# Patient Record
Sex: Female | Born: 2000 | Race: White | Hispanic: No | Marital: Single | Smoking: Never smoker
Health system: Southern US, Community
[De-identification: ages and names within clinical notes are randomized; demographics above are authoritative.]

## PROBLEM LIST (undated history)

## (undated) DIAGNOSIS — Z789 Other specified health status: Secondary | ICD-10-CM

## (undated) HISTORY — DX: Other specified health status: Z78.9

## (undated) HISTORY — PX: TONSILLECTOMY: SUR1361

---
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
## 2022-11-22 ENCOUNTER — Ambulatory Visit (LOCAL_COMMUNITY_HEALTH_CENTER): Payer: Self-pay

## 2022-11-22 VITALS — BP 118/71 | Ht 62.52 in | Wt 114.0 lb

## 2022-11-22 DIAGNOSIS — Z3009 Encounter for other general counseling and advice on contraception: Secondary | ICD-10-CM

## 2022-11-22 DIAGNOSIS — Z3201 Encounter for pregnancy test, result positive: Secondary | ICD-10-CM

## 2022-11-22 LAB — PREGNANCY, URINE: Preg Test, Ur: POSITIVE — AB

## 2022-11-22 MED ORDER — PRENATAL 27-0.8 MG PO TABS: 1.0000 | ORAL_TABLET | Freq: Every day | ORAL | Status: AC

## 2022-11-22 NOTE — Progress Notes (Signed)
UPT positive. Plans prenatal care at [facility]. Positive preg packet given and reviewed.   The patient was dispensed prenatal vitamins #100 today per SO Dr Lorrin Mais. I provided counseling today regarding the medication. We discussed the medication, the side effects and when to call clinic. Patient given the opportunity to ask questions. Questions answered.   Sent to clerk for presumptive eligiblility/medicaid preg women. Jerel Shepherd, RN

## 2023-01-08 NOTE — L&D Delivery Note (Signed)
 Delivery Note  First Stage: Labor onset: 1700 Augmentation : AROM Analgesia /Anesthesia intrapartum: none AROM at 0501  Second Stage: Complete dilation at 0550 Onset of pushing at 0552 FHR second stage difficulty tracing d/t maternal position, overall reassuring   Delivery of a viable female infant 07/18/2023 at 0646 by Edsel Blush, CNM. delivery of fetal head in OA position with restitution to LOA. No nuchal cord;  Anterior then posterior shoulders delivered easily with gentle downward traction. Baby placed on mom's chest, and attended to by peds.  Cord double clamped after cessation of pulsation, cut by FOB  Third Stage: Placenta delivered Keren intact with 3 VC @ [phone] Placenta disposition: discarded Uterine tone firm / bleeding scant  Cosmetic perineal/bilateral sulcal laceration identified  Anesthesia for repair: lidocaine , IV fentanyl  Repair 2-0 Vicryl, repaired the sulcal laceration, requested Dr. Leonce to come repair th e cosmetic portion of the perineal laceration Est. Blood Loss (mL):  Complications: none  Mom to postpartum.  Baby to Couplet care / Skin to Skin.  Newborn: Birth Weight: TBD, infant skin-to-skin  Apgar Scores: 8, 9 Feeding planned: formula feeding

## 2023-01-09 DIAGNOSIS — Z3400 Encounter for supervision of normal first pregnancy, unspecified trimester: Secondary | ICD-10-CM | POA: Insufficient documentation

## 2023-01-30 LAB — OB RESULTS CONSOLE RPR
RPR: NONREACTIVE
RPR: NONREACTIVE

## 2023-01-30 LAB — OB RESULTS CONSOLE RUBELLA ANTIBODY, IGM
Rubella: IMMUNE
Rubella: IMMUNE

## 2023-01-30 LAB — OB RESULTS CONSOLE VARICELLA ZOSTER ANTIBODY, IGG
Varicella: IMMUNE
Varicella: IMMUNE

## 2023-01-30 LAB — OB RESULTS CONSOLE HIV ANTIBODY (ROUTINE TESTING): HIV: NONREACTIVE

## 2023-01-30 LAB — OB RESULTS CONSOLE HEPATITIS B SURFACE ANTIGEN: Hepatitis B Surface Ag: NEGATIVE

## 2023-01-30 LAB — OB RESULTS CONSOLE GC/CHLAMYDIA
Chlamydia: NEGATIVE
Neisseria Gonorrhea: NEGATIVE

## 2023-01-30 LAB — OB RESULTS CONSOLE ABO/RH: RH Type: POSITIVE

## 2023-05-02 LAB — OB RESULTS CONSOLE HIV ANTIBODY (ROUTINE TESTING): HIV: NONREACTIVE

## 2023-06-25 ENCOUNTER — Other Ambulatory Visit: Payer: Self-pay

## 2023-06-25 DIAGNOSIS — O99013 Anemia complicating pregnancy, third trimester: Secondary | ICD-10-CM | POA: Insufficient documentation

## 2023-06-25 LAB — OB RESULTS CONSOLE GBS: GBS: NEGATIVE

## 2023-06-25 LAB — OB RESULTS CONSOLE GC/CHLAMYDIA
Chlamydia: NEGATIVE
Neisseria Gonorrhea: NEGATIVE

## 2023-06-25 NOTE — Progress Notes (Signed)
 Labs:  -Hgb 9.3 on 06/25/2023  Assessment: 1. Maternal iron deficiency anemia affecting pregnancy in third trimester, antepartum     Plan -IV Iron transfusion with venofer 500 g x 1 dose   Katherine Blackburn, CNM Certified Nurse Midwife [facility]

## 2023-07-02 ENCOUNTER — Ambulatory Visit
Admission: RE | Admit: 2023-07-02 | Discharge: 2023-07-02 | Disposition: A | Discharge status: Home / Self Care | Source: Ambulatory Visit

## 2023-07-02 DIAGNOSIS — O99013 Anemia complicating pregnancy, third trimester: Secondary | ICD-10-CM | POA: Diagnosis present

## 2023-07-02 DIAGNOSIS — Z3A39 39 weeks gestation of pregnancy: Secondary | ICD-10-CM | POA: Insufficient documentation

## 2023-07-02 DIAGNOSIS — D509 Iron deficiency anemia, unspecified: Secondary | ICD-10-CM | POA: Diagnosis not present

## 2023-07-02 MED ORDER — IRON SUCROSE 500 MG IVPB - SIMPLE MED
500.0000 mg | Freq: Once | INTRAVENOUS | Status: AC
  Administered 2023-07-02: 500 mg via INTRAVENOUS
  Filled 2023-07-02: qty 500

## 2023-07-02 MED ORDER — ALBUTEROL SULFATE (2.5 MG/3ML) 0.083% IN NEBU: 2.5000 mg | INHALATION_SOLUTION | Freq: Once | RESPIRATORY_TRACT | Status: DC | PRN

## 2023-07-02 MED ORDER — DIPHENHYDRAMINE HCL 50 MG/ML IJ SOLN: 25.0000 mg | Freq: Once | INTRAMUSCULAR | Status: DC | PRN

## 2023-07-02 MED ORDER — SODIUM CHLORIDE 0.9 % IV BOLUS: 500.0000 mL | Freq: Once | INTRAVENOUS | Status: DC | PRN

## 2023-07-02 MED ORDER — METHYLPREDNISOLONE SODIUM SUCC 125 MG IJ SOLR: 125.0000 mg | Freq: Once | INTRAMUSCULAR | Status: DC | PRN

## 2023-07-02 MED ORDER — SODIUM CHLORIDE 0.9 % IV SOLN: INTRAVENOUS | Status: DC | PRN

## 2023-07-02 MED ORDER — EPINEPHRINE 0.3 MG/0.3ML IJ SOAJ: 0.3000 mg | Freq: Once | INTRAMUSCULAR | Status: DC | PRN

## 2023-07-02 NOTE — Progress Notes (Signed)
 No LMP recorded. Patient is pregnant. Estimated Date of Delivery: 07/22/23  23 y.o. G1P0 at [redacted]w[redacted]d  The primary encounter diagnosis was Encounter for supervision of other normal pregnancy in third trimester (HHS-HCC). Diagnoses of Anemia affecting pregnancy in third trimester (HHS-HCC), Headache in pregnancy, antepartum (HHS-HCC), and Maternal iron  deficiency anemia complicating pregnancy in third trimester (HHS-HCC) were also pertinent to this visit.  S:   Patient concerns today:  - lost her mucus plug a few days ago  Chief Complaint  Patient presents with  . Routine Prenatal Visit    Reports: good fetal movement, periodic braxton hicks contractions  Denies bleeding, contractions, cramping or leaking.   O:   See [facility] flowsheets. BP 112/72   Pulse 79   Ht 160 cm (5' 3)   Wt 73.8 kg (162 lb 12.8 oz)   BMI 28.84 kg/m  Gen: NAD  Pulm: No use of accessory muscles, normal respirations Abdomen: Gravid, nontender  Fundal height: 37cm  FHT by doppler (distinguished from mom): 140bpm  S=D Pelvic: SVE deferred  Ext : mild edema, no rashes Psych: Mood, insight, judgement intact  A/P:  23 y.o. G1P0 at [redacted]w[redacted]d   - Problem list reviewed and/or updated.  1. Encounter for supervision of other normal pregnancy in third trimester (HHS-HCC) GBS negative. Labor plans reviewed; planning nitrous oxide for pain control if possible, would like to avoid epidural if possible. Kick counts reviewed with the patient in detail.  Patient instructed to assess for fetal activity daily at regular intervals.  Counts to be done if decreased activity noted.  Patient instructed to contact the office if counts do not reveal adequate movements. Labor precautions (ROM, vaginal bleeding, s/s labor) reviewed.  Pt verbalized understanding.  2. Anemia affecting pregnancy in third trimester (HHS-HCC) Recommended iron  infusions d/t Hgb 9.3 Next iron  infusion today  3. Headache in pregnancy, antepartum  (HHS-HCC) Had a headache last week, none since, overall doing well    Call with bleeding, contractions, PROM, or decreased fetal movement.   Return in about 1 week (around 07/09/2023) for routine Prenatal.   I personally performed the service, non-incident to. [facility])   EDSEL CHARLIES BLUSH , CNM 07/02/2023 9:51 AM

## 2023-07-02 NOTE — Progress Notes (Signed)
 Pt d/c home.  NAD, denies pain.  Safety maintained.

## 2023-07-09 NOTE — Progress Notes (Signed)
 Obstetrics & Gynecology Office Visit  Subjective  Katherine Blackburn is a 23 y.o. G1P0 at [redacted]w[redacted]d being seen today for ongoing prenatal care.  She is currently monitored for tthis low-risk pregnancy.  No LMP recorded. Patient is pregnant. Estimated Date of Delivery: 07/22/23  History of Present Illness Katherine Blackburn is a 23 year old female who presents for a prenatal appointment at [redacted] weeks gestation.  She is currently [redacted] weeks pregnant and recently underwent an iron  transfusion last week. Following the transfusion, she experienced fatigue and felt faint the next day, attributing some of her symptoms to the hot weather. No current headaches have been noted since the day of the transfusion.  Fetal movements have been good, with notable activity described as 'waves' in her stomach. She is uncertain if she has experienced contractions but describes having menstrual-like cramps and abdominal tightness. She reports difficulty sleeping due to discomfort and tightness, particularly at night, but does not have difficulty falling asleep or waking up to urinate.  She plans to formula feed her baby and has prepared her hospital bag. She notes that the baby feels like she is pressing on her bladder, and she occasionally feels large movements, which she attributes to limbs. No back pain is experienced.    Pt denies contractions, vaginal bleeding, leaking fluid. Endorses good fetal movement Pt denies HA, VD or RUQ pain.   Objective  BP 138/83   Pulse 80   Ht 160 cm (5' 3)   Wt 73.9 kg (163 lb)   BMI 28.87 kg/m    Fetal Status: Fetal Heart Rate: 155 bpm Fundal Height (cm): 37 cm Movement: Present  Presentation: Cephalic Pre-pregnant weight: 50.0 kg (110 lb)  TWG: 24 kg (53 lb)  Gen: NAD  Pulm: No use of accessory muscles, normal respirations Abdomen: Gravid, nontender Ext : No edema, no rashes.   Psych: Mood, insight, judgement intact SVE: deferred  Fundal height: S=D  Assessment   23 y.o. G1P0  at [redacted]w[redacted]d by  07/22/2023, by Ultrasound presenting for routine prenatal visit  The primary encounter diagnosis was Encounter for supervision of other normal pregnancy in third trimester (HHS-HCC). A diagnosis of Maternal iron  deficiency anemia complicating pregnancy in third trimester (HHS-HCC) was also pertinent to this visit.   Plan   Problem list reviewed and/or updated   Assessment & Plan GBS negative. Labor plans reviewed. Kick counts reviewed with the patient in detail.  Patient instructed to assess for fetal activity daily at regular intervals.  Counts to be done if decreased activity noted.  Patient instructed to contact the office if counts do not reveal adequate movements. Labor precautions (ROM, vaginal bleeding, s/s labor) reviewed.  Pt verbalized understanding.   Pregnancy [redacted] weeks gestation with normal symptoms. Fetus in cephalic presentation, occiput posterior. Discussed labor onset and induction timeline. Preference for spontaneous labor unless complications arise. - Continue weekly prenatal appointments to monitor blood pressure and fetal position. - Encourage birth ball and prenatal yoga for fetal positioning. - Advise on labor signs and hospital admission timing. - Ensure hospital bags are prepared. - Plan for formula feeding postpartum.  Headache No recent headaches. Monitoring blood pressure as a potential factor. - Monitor blood pressure at weekly appointments.  General Health Maintenance Emphasized hydration, heat avoidance, and comfort measures. - Maintain hydration and avoid heat. - Use warm showers or baths for comfort. - Use pillows for optimal sleep positioning. - Walk frequently, avoiding peak heat.    Return in about 1 week (around 07/16/2023) for  routine PNC with CNM.   Attestation Statement:   I personally performed the service, non-incident to. (WP)   ANNA MICHELLE MACKIE, CNM   This note has been created using automated tools and reviewed for  accuracy by [facility].

## 2023-07-15 NOTE — Progress Notes (Signed)
 No LMP recorded. Patient is pregnant. Estimated Date of Delivery: 07/22/23  23 y.o. G1P0 at [redacted]w[redacted]d  The primary encounter diagnosis was Encounter for supervision of other normal pregnancy in third trimester (HHS-HCC). Diagnoses of Anemia affecting pregnancy in third trimester (HHS-HCC) and Maternal iron  deficiency anemia complicating pregnancy in third trimester (HHS-HCC) were also pertinent to this visit.  S:   Patient concerns today:  - wants to know if it is normal that she has not noticed any contractions  Chief Complaint  Patient presents with  . Routine Prenatal Visit    Reports: good fetal movement  Denies bleeding, contractions, cramping or leaking.   O:   See [facility] flowsheets. BP 123/71   Pulse 85   Wt 75.2 kg (165 lb 12.8 oz)   BMI 29.37 kg/m  Gen: NAD  Pulm: No use of accessory muscles, normal respirations Abdomen: Gravid, nontender  Fundal height: 39cm  FHT by doppler (distinguished from mom): 130bpm  S=D Pelvic: SVE deferred  Ext : mild edema, no rashes Psych: Mood, insight, judgement intact  A/P:  23 y.o. G1P0 at [redacted]w[redacted]d   - Problem list reviewed and/or updated.  1. Encounter for supervision of other normal pregnancy in third trimester (HHS-HCC) GBS negative. Labor plans reviewed; hoping to avoid an epidural. Kick counts reviewed with the patient in detail.  Patient instructed to assess for fetal activity daily at regular intervals.  Counts to be done if decreased activity noted.  Patient instructed to contact the office if counts do not reveal adequate movements. Labor precautions (ROM, vaginal bleeding, s/s labor) reviewed.  Pt verbalized understanding. Reviewed that it is normal to experience no or minimal contractions until labor starts. It can also be normal to experience regular contractions and not be in labor.   2. Anemia affecting pregnancy in third trimester (HHS-HCC) Taking iron  supplements    Call with bleeding, contractions, PROM, or  decreased fetal movement.   Return in about 1 week (around 07/22/2023) for routine Prenatal.   I personally performed the service, non-incident to. [facility])   DANIELLE CHARLIES BLUSH , CNM 07/15/2023 3:48 PM

## 2023-07-17 ENCOUNTER — Other Ambulatory Visit: Payer: Self-pay

## 2023-07-17 ENCOUNTER — Encounter: Payer: Self-pay | Admitting: Obstetrics and Gynecology

## 2023-07-17 ENCOUNTER — Inpatient Hospital Stay
Admission: EM | Admit: 2023-07-17 | Discharge: 2023-07-20 | DRG: 806 | Disposition: A | Discharge status: Home / Self Care

## 2023-07-17 DIAGNOSIS — Z3A39 39 weeks gestation of pregnancy: Secondary | ICD-10-CM

## 2023-07-17 DIAGNOSIS — O26893 Other specified pregnancy related conditions, third trimester: Secondary | ICD-10-CM | POA: Diagnosis present

## 2023-07-17 DIAGNOSIS — D62 Acute posthemorrhagic anemia: Secondary | ICD-10-CM | POA: Diagnosis not present

## 2023-07-17 DIAGNOSIS — O479 False labor, unspecified: Principal | ICD-10-CM | POA: Diagnosis present

## 2023-07-17 DIAGNOSIS — F1290 Cannabis use, unspecified, uncomplicated: Secondary | ICD-10-CM | POA: Diagnosis present

## 2023-07-17 DIAGNOSIS — O9081 Anemia of the puerperium: Secondary | ICD-10-CM | POA: Diagnosis not present

## 2023-07-17 DIAGNOSIS — O99019 Anemia complicating pregnancy, unspecified trimester: Secondary | ICD-10-CM | POA: Diagnosis present

## 2023-07-17 MED ORDER — LIDOCAINE HCL (PF) 1 % IJ SOLN
30.0000 mL | INTRAMUSCULAR | Status: AC | PRN
  Administered 2023-07-18: 30 mL via SUBCUTANEOUS

## 2023-07-17 MED ORDER — ACETAMINOPHEN 325 MG PO TABS
650.0000 mg | ORAL_TABLET | ORAL | Status: DC | PRN
  Filled 2023-07-17: qty 2

## 2023-07-17 MED ORDER — CALCIUM CARBONATE ANTACID 500 MG PO CHEW: 2.0000 | CHEWABLE_TABLET | ORAL | Status: DC | PRN

## 2023-07-17 MED ORDER — OXYTOCIN BOLUS FROM INFUSION
333.0000 mL | Freq: Once | INTRAVENOUS | Status: AC
  Administered 2023-07-18: 333 mL via INTRAVENOUS

## 2023-07-17 MED ORDER — LACTATED RINGERS IV SOLN: 500.0000 mL | INTRAVENOUS | Status: DC | PRN

## 2023-07-17 MED ORDER — ONDANSETRON HCL 4 MG/2ML IJ SOLN
4.0000 mg | Freq: Four times a day (QID) | INTRAMUSCULAR | Status: DC | PRN
  Filled 2023-07-17: qty 2

## 2023-07-17 MED ORDER — ACETAMINOPHEN 325 MG PO TABS
650.0000 mg | ORAL_TABLET | ORAL | Status: DC | PRN
  Administered 2023-07-18: 650 mg via ORAL

## 2023-07-17 MED ORDER — SOD CITRATE-CITRIC ACID 500-334 MG/5ML PO SOLN: 30.0000 mL | ORAL | Status: DC | PRN

## 2023-07-17 MED ORDER — FENTANYL CITRATE (PF) 100 MCG/2ML IJ SOLN
50.0000 ug | INTRAMUSCULAR | Status: DC | PRN
  Administered 2023-07-18: 100 ug via INTRAVENOUS
  Filled 2023-07-17: qty 2

## 2023-07-17 MED ORDER — OXYTOCIN-SODIUM CHLORIDE 30-0.9 UT/500ML-% IV SOLN
2.5000 [IU]/h | INTRAVENOUS | Status: DC
  Administered 2023-07-18: 2.5 [IU]/h via INTRAVENOUS
  Filled 2023-07-17: qty 500

## 2023-07-17 MED ORDER — LACTATED RINGERS IV SOLN: INTRAVENOUS | Status: DC

## 2023-07-17 NOTE — OB Triage Note (Signed)
 Pt Katherine Blackburn 23 y.o. presents to labor and delivery triage reporting contractions that started around 4pm today.Pt is a G1P0000 at [redacted]w[redacted]d . Pt denies signs and symptons consistent with rupture of membranes or active vaginal bleeding. Patient states positive fetal movement. External FM and TOCO applied to non-tender abdomen and assessing. Initial FHR 140. Vital signs obtained and within normal limits. Provider notified of pt.

## 2023-07-17 NOTE — H&P (Signed)
 OB History & Physical   History of Present Illness:  Chief Complaint:   HPI:  Katherine Blackburn is a 23 y.o. G1P0000 female at [redacted]w[redacted]d dated by [redacted]w[redacted]d US  not consistent with LMP.  She presents to L&D for uterine contractions that started around 5pm and have become steadily stronger and closer together. She denies vaginal bleeding and leaking fluid. She endorses good fetal movement.   Pregnancy Issues: 1. 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 Anemia, received iron  infusions  Maternal Medical History:   Past Medical History:  Diagnosis Date   Patient denies medical problems     Past Surgical History:  Procedure Laterality Date   TONSILLECTOMY     age 19    Allergies  Allergen Reactions   Other Itching    Gel Nail Polish Itching of fingers and skin peeling on fingers    Prior to Admission medications   Medication Sig Start Date End Date Taking? Authorizing Provider  ferrous sulfate 325 (65 FE) MG EC tablet Take 325 mg by mouth 3 (three) times daily with meals.   Yes [provider]  magnesium oxide (MAG-OX) 400 (240 Mg) MG tablet Take 400 mg by mouth daily.   Yes [provider]  Prenatal Vit-Fe Fumarate-FA (PRENATAL MULTIVITAMIN) TABS tablet Take 1 tablet by mouth daily at 12 noon.   Yes [provider]     Prenatal care site: [facility]   Social History: She  reports that she has never smoked. She has never used smokeless tobacco. She reports that she does not currently use alcohol. She reports current drug use. Drug: Marijuana.  Family History: family history is not on file.   Review of Systems: A full review of systems was performed and negative except as noted in the HPI.     Physical Exam:  Vital Signs: BP 126/76   Pulse 81   Temp 98.4 F (36.9 C) (Oral)   LMP 10/05/2022 (Exact Date)  General: no acute distress.  HEENT: normocephalic, atraumatic Heart: regular rate & rhythm.  No  murmurs/rubs/gallops Lungs: clear to auscultation bilaterally, normal respiratory effort Abdomen: soft, gravid, non-tender;  EFW: 7lb Pelvic:   External: Normal external female genitalia  Cervix: Dilation: 5.5 / Effacement (%): 80 / Station: -1    Extremities: non-tender, symmetric, mild edema bilaterally.  DTRs: +2  Neurologic: Alert & oriented x 3.    No results found for this or any previous visit (from the past 24 hours).  Pertinent Results:  Prenatal Labs: Blood type/Rh B positive  Antibody screen neg  Rubella Immune  Varicella Immune  RPR NR  HBsAg Neg  HIV NR  GC neg  Chlamydia neg  Genetic screening negative  1 hour GTT 73  3 hour GTT   GBS Negative   FHT: 135bpm, moderate variability, accelerations present, no decelerations, Category I tracing TOCO: contractions q2-87min, palpate moderate SVE:  Dilation: 5.5 / Effacement (%): 80 / Station: -1    Cephalic by leopolds  No results found.  Assessment:  Katherine Blackburn is a 23 y.o. G1P0000 female at [redacted]w[redacted]d with active labor admission.   Plan:  1. Admit to Labor & Delivery; consents reviewed and obtained - Dr. Leonce notified of admission and plan of care  2. Fetal Well being  - Fetal Tracing: Category I tracing - Group B Streptococcus ppx indicated: n/a, GBS negative - Presentation: vertex confirmed by SVE   3. Routine OB: - Prenatal labs reviewed, as above - Rh  positive - CBC, T&S, RPR on admit - Clear fluids, saline lock  4. Monitoring of Labor -  Contractions q2-41min, external toco in place -  Pelvis adequate for trial of labor -  Plan for augmentation with AROM And/or pitocin  if needed -  Plan for continuous fetal monitoring  -  Maternal pain control as desired; requesting unmedicated comfort measures but open to epidural if needed - Anticipate vaginal delivery  5. Post Partum Planning: - Infant feeding: formula - Contraception: condoms - Tdap: declined - Flu: out of season  6. Marijuana use  in pregnancy: - reports last marijuana use was December 2024 - no UDS in the pregnancy - UDS in L&D  Edsel Charlies Blush, CNM 07/17/23 11:36 PM

## 2023-07-18 ENCOUNTER — Encounter: Payer: Self-pay | Admitting: Obstetrics and Gynecology

## 2023-07-18 DIAGNOSIS — F1290 Cannabis use, unspecified, uncomplicated: Secondary | ICD-10-CM | POA: Diagnosis present

## 2023-07-18 DIAGNOSIS — O99019 Anemia complicating pregnancy, unspecified trimester: Secondary | ICD-10-CM | POA: Diagnosis present

## 2023-07-18 LAB — TYPE AND SCREEN
ABO/RH(D): B POS
Antibody Screen: NEGATIVE

## 2023-07-18 LAB — CBC
HCT: 33.8 % — ABNORMAL LOW (ref 36.0–46.0)
Hemoglobin: 11 g/dL — ABNORMAL LOW (ref 12.0–15.0)
MCH: 28.1 pg (ref 26.0–34.0)
MCHC: 32.5 g/dL (ref 30.0–36.0)
MCV: 86.2 fL (ref 80.0–100.0)
Platelets: 269 K/uL (ref 150–400)
RBC: 3.92 MIL/uL (ref 3.87–5.11)
RDW: 20 % — ABNORMAL HIGH (ref 11.5–15.5)
WBC: 9.6 K/uL (ref 4.0–10.5)
nRBC: 0 % (ref 0.0–0.2)

## 2023-07-18 LAB — URINE DRUG SCREEN, QUALITATIVE (ARMC ONLY)
Amphetamines, Ur Screen: NOT DETECTED
Barbiturates, Ur Screen: NOT DETECTED
Benzodiazepine, Ur Scrn: NOT DETECTED
Cannabinoid 50 Ng, Ur ~~LOC~~: NOT DETECTED
Cocaine Metabolite,Ur ~~LOC~~: NOT DETECTED
MDMA (Ecstasy)Ur Screen: NOT DETECTED
Methadone Scn, Ur: NOT DETECTED
Opiate, Ur Screen: NOT DETECTED
Phencyclidine (PCP) Ur S: NOT DETECTED
Tricyclic, Ur Screen: NOT DETECTED

## 2023-07-18 LAB — ABO/RH: ABO/RH(D): B POS

## 2023-07-18 LAB — RPR: RPR Ser Ql: NONREACTIVE

## 2023-07-18 MED ORDER — PRENATAL MULTIVITAMIN CH: 1.0000 | ORAL_TABLET | Freq: Every day | ORAL | Status: DC

## 2023-07-18 MED ORDER — COCONUT OIL OIL: 1.0000 | TOPICAL_OIL | Status: DC | PRN

## 2023-07-18 MED ORDER — IBUPROFEN 800 MG PO TABS
800.0000 mg | ORAL_TABLET | Freq: Four times a day (QID) | ORAL | Status: DC | PRN
  Administered 2023-07-18: 800 mg via ORAL

## 2023-07-18 MED ORDER — ONDANSETRON HCL 4 MG/2ML IJ SOLN: 4.0000 mg | INTRAMUSCULAR | Status: DC | PRN

## 2023-07-18 MED ORDER — MISOPROSTOL 200 MCG PO TABS
ORAL_TABLET | ORAL | Status: AC
  Filled 2023-07-18: qty 4

## 2023-07-18 MED ORDER — IBUPROFEN 800 MG PO TABS
ORAL_TABLET | ORAL | Status: AC
  Administered 2023-07-19: 600 mg via ORAL
  Filled 2023-07-18: qty 1

## 2023-07-18 MED ORDER — LIDOCAINE HCL (PF) 1 % IJ SOLN
INTRAMUSCULAR | Status: AC
  Administered 2023-07-18: 30 mL
  Filled 2023-07-18: qty 30

## 2023-07-18 MED ORDER — TETANUS-DIPHTH-ACELL PERTUSSIS 5-2.5-18.5 LF-MCG/0.5 IM SUSY: 0.5000 mL | PREFILLED_SYRINGE | Freq: Once | INTRAMUSCULAR | Status: DC

## 2023-07-18 MED ORDER — SIMETHICONE 80 MG PO CHEW: 80.0000 mg | CHEWABLE_TABLET | ORAL | Status: DC | PRN

## 2023-07-18 MED ORDER — WITCH HAZEL-GLYCERIN EX PADS
1.0000 | MEDICATED_PAD | CUTANEOUS | Status: DC | PRN
  Administered 2023-07-19 (×2): 1 via TOPICAL
  Filled 2023-07-18 (×3): qty 100

## 2023-07-18 MED ORDER — BENZOCAINE-MENTHOL 20-0.5 % EX AERO
1.0000 | INHALATION_SPRAY | CUTANEOUS | Status: DC | PRN
  Filled 2023-07-18: qty 56

## 2023-07-18 MED ORDER — ONDANSETRON HCL 4 MG PO TABS: 4.0000 mg | ORAL_TABLET | ORAL | Status: DC | PRN

## 2023-07-18 MED ORDER — SENNOSIDES-DOCUSATE SODIUM 8.6-50 MG PO TABS
2.0000 | ORAL_TABLET | Freq: Every day | ORAL | Status: DC
  Administered 2023-07-19 – 2023-07-20 (×2): 2 via ORAL
  Filled 2023-07-18 (×2): qty 2

## 2023-07-18 MED ORDER — IBUPROFEN 600 MG PO TABS
600.0000 mg | ORAL_TABLET | Freq: Four times a day (QID) | ORAL | Status: DC
  Administered 2023-07-18 – 2023-07-20 (×6): 600 mg via ORAL
  Filled 2023-07-18 (×8): qty 1

## 2023-07-18 MED ORDER — DIBUCAINE (PERIANAL) 1 % EX OINT
1.0000 | TOPICAL_OINTMENT | CUTANEOUS | Status: DC | PRN
  Filled 2023-07-18 (×2): qty 28

## 2023-07-18 MED ORDER — DIPHENHYDRAMINE HCL 25 MG PO CAPS: 25.0000 mg | ORAL_CAPSULE | Freq: Four times a day (QID) | ORAL | Status: DC | PRN

## 2023-07-18 MED ORDER — ACETAMINOPHEN 325 MG PO TABS: 650.0000 mg | ORAL_TABLET | ORAL | Status: DC | PRN

## 2023-07-18 MED ORDER — ZOLPIDEM TARTRATE 5 MG PO TABS: 5.0000 mg | ORAL_TABLET | Freq: Every evening | ORAL | Status: DC | PRN

## 2023-07-18 NOTE — Progress Notes (Signed)
 Called to see patient after delivery by CNM due to patient discomfort and incomplete second degree repair.  Per CNM, patient had unmedicated 2nd degree vaginal and perineal laceration. She had repaired the sulcal lacerations and most of the vaginal and perineal. There were lateral areas that need to be re-approximated.   The patient had significant edema of the area when I arrived. The sulcal area appeared re-approximated when I arrived, though I was unable to get a good exam due to patient discomfort. She was no bleeding from these areas and from what I could palpate the appeared adequately repaired. I injected additional lidocaine  and performed a more superficial repair of the labial and a portion of the perineum. There was a left labial laceration that was hemostatic that I did not repair and will allow to re-approximate on its own. Patient did tolerate the procedure well overall.   Blood loss about 5-10 mL with my portion of the repair.  See CNM note for more details, as indicated.  Monitor hemoglobin for appropriate drop in hemoglobin and the usual postpartum care, otherwise.  The CNM acted as my chaperone during Holiday representative.  Garnette Mace, MD, [facility] 07/18/2023 8:47 AM

## 2023-07-18 NOTE — Discharge Summary (Signed)
 Postpartum Discharge Summary  Patient Name: Katherine Blackburn DOB: 2000/01/23 MRN: [id number]  Date of admission: 07/17/2023 Delivery date:07/18/2023 Delivering provider: TANDA HOUSTON RENEE Date of discharge: 07/20/2023  Primary OB: [facility] OFE:Ejupzwu'd last menstrual period was 10/05/2022 (exact date). EDC Estimated Date of Delivery: 07/22/23 Gestational Age at Delivery: [redacted]w[redacted]d   Admitting diagnosis: Uterine contractions [O47.9] Intrauterine pregnancy: [redacted]w[redacted]d     Secondary diagnosis:   Principal Problem:   NSVD (normal spontaneous vaginal delivery) Active Problems:   Uterine contractions   Marijuana use during pregnancy   Anemia affecting pregnancy  Discharge Diagnosis: Term Pregnancy Delivered                                                Post partum procedures:IV Venofer   Augmentation:: AROM Complications: None Delivery Type: spontaneous vaginal delivery Anesthesia: non-pharmacological methods Placenta: spontaneous To Pathology: No  Laceration: bilateral sulcal repaired by D. TANDA, CNM, cosmetic perineal repair repaired by Dr. Leonce Episiotomy: none  Prenatal Labs:  Blood type/Rh B positive  Antibody screen neg  Rubella Immune  Varicella Immune  RPR NR  HBsAg Neg  HIV NR  GC neg  Chlamydia neg  Genetic screening negative  1 hour GTT 73  3 hour GTT    GBS Negative    Hospital course: Onset of Labor With Vaginal Delivery      23 y.o. yo G1P1001 at [redacted]w[redacted]d was admitted in Active Labor on 07/17/2023. Labor course was without complication. She was augmented with AROM and progressed to 10/100/+2 and pushed for about 1hr, delivering viable female infant. Membrane Rupture Time/Date: 5:01 AM,07/18/2023  Delivery Method:Vaginal, Spontaneous Operative Delivery:N/A Episiotomy:   Lacerations:  1st degree;Sulcus;Perineal;2nd degree Patient had a postpartum course complicated by postpartum anemia. IV venofer  given for 1 dose. She was instructed to continue  iron  supplements every other day for 4-6 weeks postpartum.  She is ambulating, tolerating a regular diet, passing flatus, and urinating well. Patient is discharged home in stable condition on 07/20/23.  Newborn Data: Sharilyn  Birth date:07/18/2023 Birth time:6:46 AM Gender:Female Living status:Living Apgars:8 ,9  Weight:3660 g  Magnesium Sulfate received: No BMZ received: No Rhophylac:No MMR:No Varivax vaccine given: was not indicated T-DaP:declined Flu: No  Transfusion:No  Physical exam  Vitals:   07/19/23 1530 07/19/23 1907 07/20/23 0427 07/20/23 0809  BP: 120/73 122/79 101/63 (!) 105/59  Pulse: 88 78 72 88  Resp: 18 18 18 18   Temp: 98.4 F (36.9 C) 98 F (36.7 C) 98.5 F (36.9 C) 98.4 F (36.9 C)  TempSrc: Oral   Oral  SpO2:  99% 96% 99%  Weight:      Height:       General: alert, cooperative, and no distress Lochia: appropriate Uterine Fundus: firm Perineum:minimal edema/repair well approximated DVT Evaluation: No evidence of DVT seen on physical exam.  Labs: Lab Results  Component Value Date   WBC 11.4 (H) 07/19/2023   HGB 8.9 (L) 07/19/2023   HCT 28.8 (L) 07/19/2023   MCV 89.7 07/19/2023   PLT 234 07/19/2023       No data to display         Edinburgh Score:    07/18/2023    3:01 PM  Edinburgh Postnatal Depression Scale Screening Tool  I have been able to laugh and see the funny side of things. 0  I have looked forward with  enjoyment to things. 0  I have blamed myself unnecessarily when things went wrong. 0  I have been anxious or worried for no good reason. 0  I have felt scared or panicky for no good reason. 0  Things have been getting on top of me. 0  I have been so unhappy that I have had difficulty sleeping. 0  I have felt sad or miserable. 0  I have been so unhappy that I have been crying. 0  The thought of harming myself has occurred to me. 0  Edinburgh Postnatal Depression Scale Total 0    Risk assessment for postpartum VTE and  prophylactic treatment: Very high risk factors: None High risk factors: None Moderate risk factors: None  Postpartum VTE prophylaxis with LMWH not indicated  After visit meds:  Allergies as of 07/20/2023       Reactions   Other Itching   Gel Nail Polish Itching of fingers and skin peeling on fingers        Medication List     TAKE these medications    acetaminophen  500 MG tablet Commonly known as: TYLENOL  Take 2 tablets (1,000 mg total) by mouth every 6 (six) hours as needed (for pain scale < 4).   ferrous sulfate  325 (65 FE) MG EC tablet Take 1 tablet (325 mg total) by mouth every other day. What changed: when to take this   ibuprofen  600 MG tablet Commonly known as: ADVIL  Take 1 tablet (600 mg total) by mouth every 6 (six) hours as needed for mild pain (pain score 1-3), moderate pain (pain score 4-6) or cramping.   magnesium oxide 400 (240 Mg) MG tablet Commonly known as: MAG-OX Take 400 mg by mouth daily.   prenatal multivitamin Tabs tablet Take 1 tablet by mouth daily at 12 noon.       Discharge home in stable condition Infant Feeding: Bottle Infant Disposition:home with mother Discharge instruction: per After Visit Summary and Postpartum booklet. Activity: Advance as tolerated. Pelvic rest for 6 weeks.  Diet: routine diet and iron  rich diet Anticipated Birth Control: Condoms Postpartum Appointment:6 weeks Additional Postpartum F/U: None Future Appointments:No future appointments. Follow up Visit:  Follow-up Information     Tanda Edsel Fuller, CNM. Schedule an appointment as soon as possible for a visit in 6 week(s).   Specialty: Certified Nurse Midwife Why: postpartum visit Contact information: [address] [place] [address] [phone]                 Plan:  Katherine Blackburn was discharged to home in good condition. Follow-up appointment as directed.    Signed:  Therisa CHRISTELLA Pillow, CNM 07/20/2023 9:38 AM  Therisa Pillow, CNM Certified Nurse Midwife [facility]

## 2023-07-18 NOTE — Progress Notes (Signed)
 Labor Progress Note  Katherine Blackburn is a 23 y.o. G1P0000 at [redacted]w[redacted]d by ultrasound admitted for active labor  Subjective: notified by RN that patient has a swollen anterior lip and an urge to push  Objective: BP (!) 118/90   Pulse 65   Temp 98.2 F (36.8 C) (Oral)   Resp 14   Ht 5' 4 (1.626 m)   Wt 75.3 kg   LMP 10/05/2022 (Exact Date)   BMI 28.49 kg/m  Notable VS details: reviewed  Fetal Assessment: FHT:  FHR: 125 bpm, variability: moderate,  accelerations:  Present,  decelerations:  Absent, intermittently recording maternal heart rate Category/reactivity:  Category I UC:   regular, every 2-3 minutes SVE:    Dilation: 8cm  Effacement: 100%  Station:  0  Consistency: soft  Position: anterior  Membrane status:AROM @ 0500 Amniotic color: clear  Labs: Lab Results  Component Value Date   WBC 9.6 07/17/2023   HGB 11.0 (L) 07/17/2023   HCT 33.8 (L) 07/17/2023   MCV 86.2 07/17/2023   PLT 269 07/17/2023    Assessment / Plan: 23 year old G1P0 at [redacted]w[redacted]d with active labor  Labor: Good labor progress, patient noted to be 8/100/0 with BBOW upon my exam. Encouraged patient not to push but to try position changes to allow fetal rotation and descent. AROM with clear fluid. Patient repositioned to hands and knees. Preeclampsia:  BP 118/90 Fetal Wellbeing:  Category I, RN holding monitor to better trace FHT Pain Control:  Labor support without medications and encouraged a dose of IVPM to help her relax. She declines at this time. I/D:  GBS negative Anticipated MOD:  NSVD  Edsel Charlies Blush, CNM 07/18/2023, 5:09 AM

## 2023-07-18 NOTE — Progress Notes (Signed)
 Labor Progress Note  Katherine Blackburn is a 23 y.o. G1P0000 at [redacted]w[redacted]d by ultrasound admitted for active labor  Subjective: notified by RN that patient is 9cm and tolerating labor well  Objective: BP (!) 118/90   Pulse 65   Temp 98.2 F (36.8 C) (Oral)   Resp 14   Ht 5' 4 (1.626 m)   Wt 75.3 kg   LMP 10/05/2022 (Exact Date)   BMI 28.49 kg/m  Notable VS details: reviewed  Fetal Assessment: FHT:  FHR: 135 bpm, variability: moderate,  accelerations:  Present,  decelerations:  Absent Category/reactivity:  Category I UC:   regular, every 2-3 minutes SVE:   per RN exam Dilation: 9cm  Effacement: 100%  Station:  -1  Consistency: soft  Position: middle  Membrane status:intact Amniotic color: n/a  Labs: Lab Results  Component Value Date   WBC 9.6 07/17/2023   HGB 11.0 (L) 07/17/2023   HCT 33.8 (L) 07/17/2023   MCV 86.2 07/17/2023   PLT 269 07/17/2023    Assessment / Plan: Spontaneous labor, progressing normally  Labor: Good labor progress, patient believes her water has broken, no need for augmentation at this time Preeclampsia:  BP WNL Fetal Wellbeing:  Category I Pain Control:  Labor support without medications I/D:  GBS negative Anticipated MOD:  NSVD  Edsel Charlies Blush, CNM 07/18/2023, 5:15 AM

## 2023-07-18 NOTE — Progress Notes (Signed)
 9464 Patient removed TOCO. Patient in transition and unable to tolerate the monitors. Contractions palpated strong every 2-4 minutes until delivery. Intermittent fetal heart tones traced, 0550 pulse ox placed to differentiate FHR from maternal heart rate.

## 2023-07-19 LAB — CBC
HCT: 28.8 % — ABNORMAL LOW (ref 36.0–46.0)
Hemoglobin: 8.9 g/dL — ABNORMAL LOW (ref 12.0–15.0)
MCH: 27.7 pg (ref 26.0–34.0)
MCHC: 30.9 g/dL (ref 30.0–36.0)
MCV: 89.7 fL (ref 80.0–100.0)
Platelets: 234 K/uL (ref 150–400)
RBC: 3.21 MIL/uL — ABNORMAL LOW (ref 3.87–5.11)
RDW: 20.7 % — ABNORMAL HIGH (ref 11.5–15.5)
WBC: 11.4 K/uL — ABNORMAL HIGH (ref 4.0–10.5)
nRBC: 0 % (ref 0.0–0.2)

## 2023-07-19 MED ORDER — SODIUM CHLORIDE 0.9 % IV SOLN: INTRAVENOUS | Status: AC | PRN

## 2023-07-19 MED ORDER — IRON SUCROSE 300 MG IVPB - SIMPLE MED
300.0000 mg | Freq: Once | Status: AC
  Administered 2023-07-19: 300 mg via INTRAVENOUS
  Filled 2023-07-19: qty 300

## 2023-07-19 MED ORDER — SODIUM CHLORIDE 0.9 % IV BOLUS: 500.0000 mL | Freq: Once | INTRAVENOUS | Status: DC | PRN

## 2023-07-19 MED ORDER — DIPHENHYDRAMINE HCL 50 MG/ML IJ SOLN: 25.0000 mg | Freq: Once | INTRAMUSCULAR | Status: DC | PRN

## 2023-07-19 MED ORDER — METHYLPREDNISOLONE SODIUM SUCC 125 MG IJ SOLR: 125.0000 mg | Freq: Once | INTRAMUSCULAR | Status: DC | PRN

## 2023-07-19 MED ORDER — EPINEPHRINE 0.3 MG/0.3ML IJ SOAJ: 0.3000 mg | Freq: Once | INTRAMUSCULAR | Status: DC | PRN

## 2023-07-19 MED ORDER — ALBUTEROL SULFATE (2.5 MG/3ML) 0.083% IN NEBU: 2.5000 mg | INHALATION_SOLUTION | Freq: Once | RESPIRATORY_TRACT | Status: DC | PRN

## 2023-07-19 NOTE — Progress Notes (Addendum)
 Postpartum Day  1  Subjective: 23 y.o. G1P1001 postpartum day #1 status post normal spontaneous vaginal delivery. She is ambulating, is tolerating po, is voiding spontaneously.  Her pain is well controlled on PO pain medications. Her lochia is less than menses.  Objective: BP 109/69   Pulse 93   Temp 98.6 F (37 C) (Oral)   Resp 20   Ht 5' 4 (1.626 m)   Wt 75.3 kg   LMP 10/05/2022 (Exact Date)   SpO2 94%   Breastfeeding Unknown   BMI 28.49 kg/m    Physical Exam:  General: alert, cooperative, and no distress Breasts: soft/nontender Pulm: nl effort Abdomen: soft, non-tender, active bowel sounds Uterine Fundus: firm, deviated to right at U  Perineum: minimal edema, repair well approximated Lochia: appropriate DVT Evaluation: No evidence of DVT seen on physical exam.  Recent Labs    07/17/23 2357 07/19/23 0706  HGB 11.0* 8.9*  HCT 33.8* 28.8*  WBC 9.6 11.4*  PLT 269 234    Assessment/Plan: 23 y.o. G1P1001 postpartum day # 1  1. Continue routine postpartum care -Due to void -Will recheck fundal exam after voiding  -Bladder scanner PRN   2. Infant feeding status: breast feeding -Lactation consult PRN for breastfeeding   3. Contraception plan: condoms  4. Acute blood loss anemia - clinically significant.  -Hemodynamically stable and asymptomatic -Intervention: IV iron  transfusion with venofer  ordered   5. Immunization status:   needs TDap prior to discharge   6. History of marijuana use in pregnancy  -Last use 12/2022 -UDS negative on admission    Disposition: continue inpatient postpartum care , desires discharge home today     LOS: 2 days   Katherine Blackburn, EDDY 07/19/2023, 9:26 AM   ----- Katherine Blackburn  Certified Nurse Midwife [facility]

## 2023-07-19 NOTE — TOC Initial Note (Signed)
 Transition of Care [facility]) - Initial/Assessment Note    Patient Details  Name: Katherine Blackburn MRN: [id number] Date of Birth: 2000-02-15  Transition of Care [facility]) CM/SW Contact:    Edsel DELENA Fischer, LCSW Phone Number: 07/19/2023, 10:19 AM  Clinical Narrative:          SW met with pt at bedside.  When SW arrived partner and pt mother were in the room.  SW asked family to leave.  SW completed assessment with pt.  Per pt:  Pt is a new mom and stated that  its weird, its different.  Tye Long is child's dad and also pt boyfriend in whom pt resides.  No safety/ DV concerns reported.   Pt expressed that she does not have any needs for the baby and that she has supports from her side and boyfriends side of family.  Pt stated that her and her boyfriend live on her boyfriends parents property in a tiny house ( [address], [place], [place]) with their 2 dogs. Pt contact information is [phone]. Pt stated that she has car seat and that baby will sleep in bassinet. 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  Currently employed at [employer] but on maturity leave.   Pt stated that she works at [employer] as well and that owner has asked her to purchase business from her.  Pt stated that she is thinking about it but probably in 4 years.  Pt has WIC and will apply for food stamps.  Plans to have family and partner help with baby when pt returns to work. No mental health concerns reported.  Pt did state that she was in counseling when she was younger, I dont know why.  Appt with pediatrician has been scheduled with [facility], on Monday at 2pm with Dr. Glendia.  Boyfriend will provide transportation home.  Pt did expressed that before she met Tye she had another boyfriend however he was killed in motorcycle accident.  Pt began to tear up.  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  Last used 7 months ago didn't find out I was pregnant  until 2 months and I began to ween myself off.  SW discussed Drug Screen Policy and if screening comes back positive.  No report of DSS involvement.    SW reviewed, discussed and provided pt  handouts (New Mom Checklist for Maternal Mental Health Help, What does a safe sleep environment look like?, Safe sleep for your baby, Car safety seat check up, Perinatal Mood and Anxiety Disorders (PMADS), Free Mental Health Apps, Weed to Know for [employer] and You.    SW will provide grief and substance use resources as well and attach to discharge plan        Barriers to Discharge: Continued Medical Work up   Patient Goals and CMS Choice            Expected Discharge Plan and Services In-house Referral: Clinical Social Work     Living arrangements for the past 2 months: Single Family Home                                      Prior Living Arrangements/Services Living arrangements for the past 2 months: Single Family Home Lives with:: Spouse Patient language and need for interpreter reviewed:: Yes Do you feel safe going back  to the place where you live?: Yes      Need for Family Participation in Patient Care: No (Comment) Care giver support system in place?: Yes (comment)   Criminal Activity/Legal Involvement Pertinent to Current Situation/Hospitalization: No - Comment as needed  Activities of Daily Living   ADL Screening (condition at time of admission) Independently performs ADLs?: Yes (appropriate for developmental age) Is the patient deaf or have difficulty hearing?: No Does the patient have difficulty seeing, even when wearing glasses/contacts?: No Does the patient have difficulty concentrating, remembering, or making decisions?: No  Permission Sought/Granted                  Emotional Assessment Appearance:: Appears stated age Attitude/Demeanor/Rapport: Engaged Affect (typically observed): Appropriate Orientation: : Oriented to Self, Oriented to Place,  Oriented to  Time, Oriented to Situation Alcohol / Substance Use: Other (comment) (history of substance use (Marijuana)) Psych Involvement: No (comment)  Admission diagnosis:  Uterine contractions [O47.9] Patient Active Problem List   Diagnosis Date Noted   NSVD (normal spontaneous vaginal delivery) 07/18/2023   Marijuana use during pregnancy 07/18/2023   Anemia affecting pregnancy 07/18/2023   Uterine contractions 07/17/2023   Maternal iron  deficiency anemia complicating pregnancy in third trimester 06/25/2023   Encounter for supervision of normal first pregnancy 01/09/2023   PCP:  Patient, No Pcp Per Pharmacy:   [id number], [place] - [address] AT [facility] [address] [place] [address] Phone: [phone] Fax: [phone]     Social Drivers of Health (SDOH) Social History: SDOH Screenings   Food Insecurity: No Food Insecurity (07/17/2023)  Housing: Low Risk  (07/17/2023)  Transportation Needs: No Transportation Needs (07/17/2023)  Utilities: Not At Risk (07/17/2023)  Depression (PHQ2-9): Low Risk  (11/22/2022)  Tobacco Use: Low Risk  (07/18/2023)   SDOH Interventions:     Readmission Risk Interventions     No data to display

## 2023-07-19 NOTE — Plan of Care (Signed)
 Problem: Education: Goal: Knowledge of General Education information will improve Description: Including pain rating scale, medication(s)/side effects and non-pharmacologic comfort measures 07/19/2023 0519 by Ramonita Magnolia PARAS, RN Outcome: Progressing 07/19/2023 0519 by Ramonita Magnolia PARAS, RN Outcome: Progressing   Problem: Health Behavior/Discharge Planning: Goal: Ability to manage health-related needs will improve 07/19/2023 0519 by Ramonita Magnolia PARAS, RN Outcome: Progressing 07/19/2023 0519 by Ramonita Magnolia PARAS, RN Outcome: Progressing   Problem: Clinical Measurements: Goal: Ability to maintain clinical measurements within normal limits will improve 07/19/2023 0519 by Ramonita Magnolia PARAS, RN Outcome: Progressing 07/19/2023 0519 by Ramonita Magnolia PARAS, RN Outcome: Progressing Goal: Will remain free from infection 07/19/2023 0519 by Ramonita Magnolia PARAS, RN Outcome: Progressing 07/19/2023 0519 by Ramonita Magnolia PARAS, RN Outcome: Progressing Goal: Diagnostic test results will improve 07/19/2023 0519 by Ramonita Magnolia PARAS, RN Outcome: Progressing 07/19/2023 0519 by Ramonita Magnolia PARAS, RN Outcome: Progressing Goal: Respiratory complications will improve 07/19/2023 0519 by Ramonita Magnolia PARAS, RN Outcome: Progressing 07/19/2023 0519 by Ramonita Magnolia PARAS, RN Outcome: Progressing Goal: Cardiovascular complication will be avoided 07/19/2023 0519 by Ramonita Magnolia PARAS, RN Outcome: Progressing 07/19/2023 0519 by Ramonita Magnolia PARAS, RN Outcome: Progressing   Problem: Activity: Goal: Risk for activity intolerance will decrease 07/19/2023 0519 by Ramonita Magnolia PARAS, RN Outcome: Progressing 07/19/2023 0519 by Ramonita Magnolia PARAS, RN Outcome: Progressing   Problem: Nutrition: Goal: Adequate nutrition will be maintained 07/19/2023 0519 by Ramonita Magnolia PARAS, RN Outcome: Progressing 07/19/2023 0519 by Ramonita Magnolia PARAS, RN Outcome: Progressing   Problem: Coping: Goal: Level of anxiety will decrease 07/19/2023 0519 by Ramonita Magnolia PARAS, RN Outcome:  Progressing 07/19/2023 0519 by Ramonita Magnolia PARAS, RN Outcome: Progressing   Problem: Elimination: Goal: Will not experience complications related to bowel motility 07/19/2023 0519 by Ramonita Magnolia PARAS, RN Outcome: Progressing 07/19/2023 0519 by Ramonita Magnolia PARAS, RN Outcome: Progressing Goal: Will not experience complications related to urinary retention 07/19/2023 0519 by Ramonita Magnolia PARAS, RN Outcome: Progressing 07/19/2023 0519 by Ramonita Magnolia PARAS, RN Outcome: Progressing   Problem: Pain Managment: Goal: General experience of comfort will improve and/or be controlled 07/19/2023 0519 by Ramonita Magnolia PARAS, RN Outcome: Progressing 07/19/2023 0519 by Ramonita Magnolia PARAS, RN Outcome: Progressing   Problem: Safety: Goal: Ability to remain free from injury will improve 07/19/2023 0519 by Ramonita Magnolia PARAS, RN Outcome: Progressing 07/19/2023 0519 by Ramonita Magnolia PARAS, RN Outcome: Progressing   Problem: Skin Integrity: Goal: Risk for impaired skin integrity will decrease 07/19/2023 0519 by Ramonita Magnolia PARAS, RN Outcome: Progressing 07/19/2023 0519 by Ramonita Magnolia PARAS, RN Outcome: Progressing   Problem: Education: Goal: Knowledge of disease or condition will improve 07/19/2023 0519 by Ramonita Magnolia PARAS, RN Outcome: Progressing 07/19/2023 0519 by Ramonita Magnolia PARAS, RN Outcome: Progressing Goal: Knowledge of the prescribed therapeutic regimen will improve 07/19/2023 0519 by Ramonita Magnolia PARAS, RN Outcome: Progressing 07/19/2023 0519 by Ramonita Magnolia PARAS, RN Outcome: Progressing Goal: Individualized Educational Video(s) 07/19/2023 0519 by Ramonita Magnolia PARAS, RN Outcome: Progressing 07/19/2023 0519 by Ramonita Magnolia PARAS, RN Outcome: Progressing   Problem: Clinical Measurements: Goal: Complications related to the disease process, condition or treatment will be avoided or minimized 07/19/2023 0519 by Ramonita Magnolia PARAS, RN Outcome: Progressing 07/19/2023 0519 by Ramonita Magnolia PARAS, RN Outcome: Progressing   Problem: Education: Goal: Knowledge of  Childbirth will improve 07/19/2023 0519 by Ramonita Magnolia PARAS, RN Outcome: Progressing 07/19/2023 0519 by Ramonita Magnolia PARAS, RN Outcome: Progressing Goal: Ability to make informed decisions regarding treatment and plan of care will improve 07/19/2023  [id number] by Ramonita Magnolia PARAS, RN Outcome: Progressing 07/19/2023 0519 by Ramonita Magnolia PARAS, RN Outcome: Progressing Goal: Ability to state and carry out methods to decrease the pain will improve 07/19/2023 0519 by Ramonita Magnolia PARAS, RN Outcome: Progressing 07/19/2023 0519 by Ramonita Magnolia PARAS, RN Outcome: Progressing Goal: Individualized Educational Video(s) 07/19/2023 0519 by Ramonita Magnolia PARAS, RN Outcome: Progressing 07/19/2023 0519 by Ramonita Magnolia PARAS, RN Outcome: Progressing   Problem: Coping: Goal: Ability to verbalize concerns and feelings about labor and delivery will improve 07/19/2023 0519 by Ramonita Magnolia PARAS, RN Outcome: Progressing 07/19/2023 0519 by Ramonita Magnolia PARAS, RN Outcome: Progressing   Problem: Life Cycle: Goal: Ability to make normal progression through stages of labor will improve 07/19/2023 0519 by Ramonita Magnolia PARAS, RN Outcome: Progressing 07/19/2023 0519 by Ramonita Magnolia PARAS, RN Outcome: Progressing Goal: Ability to effectively push during vaginal delivery will improve 07/19/2023 0519 by Ramonita Magnolia PARAS, RN Outcome: Progressing 07/19/2023 0519 by Ramonita Magnolia PARAS, RN Outcome: Progressing   Problem: Role Relationship: Goal: Will demonstrate positive interactions with the child 07/19/2023 0519 by Ramonita Magnolia PARAS, RN Outcome: Progressing 07/19/2023 0519 by Ramonita Magnolia PARAS, RN Outcome: Progressing   Problem: Safety: Goal: Risk of complications during labor and delivery will decrease 07/19/2023 0519 by Ramonita Magnolia PARAS, RN Outcome: Progressing 07/19/2023 0519 by Ramonita Magnolia PARAS, RN Outcome: Progressing   Problem: Pain Management: Goal: Relief or control of pain from uterine contractions will improve 07/19/2023 0519 by Ramonita Magnolia PARAS, RN Outcome:  Progressing 07/19/2023 0519 by Ramonita Magnolia PARAS, RN Outcome: Progressing   Problem: Education: Goal: Knowledge of condition will improve 07/19/2023 0519 by Ramonita Magnolia PARAS, RN Outcome: Progressing 07/19/2023 0519 by Ramonita Magnolia PARAS, RN Outcome: Progressing Goal: Individualized Educational Video(s) 07/19/2023 0519 by Ramonita Magnolia PARAS, RN Outcome: Progressing 07/19/2023 0519 by Ramonita Magnolia PARAS, RN Outcome: Progressing Goal: Individualized Newborn Educational Video(s) 07/19/2023 0519 by Ramonita Magnolia PARAS, RN Outcome: Progressing 07/19/2023 0519 by Ramonita Magnolia PARAS, RN Outcome: Progressing   Problem: Activity: Goal: Will verbalize the importance of balancing activity with adequate rest periods 07/19/2023 0519 by Ramonita Magnolia PARAS, RN Outcome: Progressing 07/19/2023 0519 by Ramonita Magnolia PARAS, RN Outcome: Progressing Goal: Ability to tolerate increased activity will improve 07/19/2023 0519 by Ramonita Magnolia PARAS, RN Outcome: Progressing 07/19/2023 0519 by Ramonita Magnolia PARAS, RN Outcome: Progressing   Problem: Coping: Goal: Ability to identify and utilize available resources and services will improve 07/19/2023 0519 by Ramonita Magnolia PARAS, RN Outcome: Progressing 07/19/2023 0519 by Ramonita Magnolia PARAS, RN Outcome: Progressing   Problem: Life Cycle: Goal: Chance of risk for complications during the postpartum period will decrease 07/19/2023 0519 by Ramonita Magnolia PARAS, RN Outcome: Progressing 07/19/2023 0519 by Ramonita Magnolia PARAS, RN Outcome: Progressing   Problem: Role Relationship: Goal: Ability to demonstrate positive interaction with newborn will improve 07/19/2023 0519 by Ramonita Magnolia PARAS, RN Outcome: Progressing 07/19/2023 0519 by Ramonita Magnolia PARAS, RN Outcome: Progressing   Problem: Skin Integrity: Goal: Demonstration of wound healing without infection will improve 07/19/2023 0519 by Ramonita Magnolia PARAS, RN Outcome: Progressing 07/19/2023 0519 by Ramonita Magnolia PARAS, RN Outcome: Progressing

## 2023-07-20 MED ORDER — FERROUS SULFATE 325 (65 FE) MG PO TBEC: 325.0000 mg | DELAYED_RELEASE_TABLET | ORAL | Status: AC

## 2023-07-20 MED ORDER — IBUPROFEN 600 MG PO TABS: 600.0000 mg | ORAL_TABLET | Freq: Four times a day (QID) | ORAL | 0 refills | Status: AC | PRN

## 2023-07-20 MED ORDER — ACETAMINOPHEN 500 MG PO TABS: 1000.0000 mg | ORAL_TABLET | Freq: Four times a day (QID) | ORAL | Status: AC | PRN

## 2023-07-20 NOTE — Discharge Instructions (Signed)
 Vaginal Delivery, Care After Refer to this sheet in the next few weeks. These discharge instructions provide you with information on caring for yourself after delivery. Your caregiver may also give you specific instructions. Your treatment has been planned according to the most current medical practices available, but problems sometimes occur. Call your caregiver if you have any problems or questions after you go home. HOME CARE INSTRUCTIONS Take over-the-counter or prescription medicines only as directed by your caregiver or pharmacist. Do not drink alcohol, especially if you are breastfeeding or taking medicine to relieve pain. Do not smoke tobacco. Continue to use good perineal care. Good perineal care includes: Wiping your perineum from back to front Keeping your perineum clean. You can do sitz baths twice a day, to help keep this area clean Do not use tampons, douche or have sex until your caregiver says it is okay. Shower only and avoid sitting in submerged water, aside from sitz baths Wear a well-fitting bra that provides breast support. Eat healthy foods. Drink enough fluids to keep your urine clear or pale yellow. Eat high-fiber foods such as whole grain cereals and breads, brown rice, beans, and fresh fruits and vegetables every day. These foods may help prevent or relieve constipation. Avoid constipation with high fiber foods or medications, such as miralax or metamucil Follow your caregiver's recommendations regarding resumption of activities such as climbing stairs, driving, lifting, exercising, or traveling. Talk to your caregiver about resuming sexual activities. Resumption of sexual activities is dependent upon your risk of infection, your rate of healing, and your comfort and desire to resume sexual activity. Try to have someone help you with your household activities and your newborn for at least a few days after you leave the hospital. Rest as much as possible. Try to rest or  take a nap when your newborn is sleeping. Increase your activities gradually. Keep all of your scheduled postpartum appointments. It is very important to keep your scheduled follow-up appointments. At these appointments, your caregiver will be checking to make sure that you are healing physically and emotionally. SEEK MEDICAL CARE IF:  You are passing large clots from your vagina. Save any clots to show your caregiver. You have a foul smelling discharge from your vagina. You have trouble urinating. You are urinating frequently. You have pain when you urinate. You have a change in your bowel movements. You have increasing redness, pain, or swelling near your vaginal incision (episiotomy) or vaginal tear. You have pus draining from your episiotomy or vaginal tear. Your episiotomy or vaginal tear is separating. You have painful, hard, or reddened breasts. You have a severe headache. You have blurred vision or see spots. You feel sad or depressed. You have thoughts of hurting yourself or your newborn. You have questions about your care, the care of your newborn, or medicines. You are dizzy or light-headed. You have a rash. You have nausea or vomiting. You were breastfeeding and have not had a menstrual period within 12 weeks after you stopped breastfeeding. You are not breastfeeding and have not had a menstrual period by the 12th week after delivery. You have a fever. SEEK IMMEDIATE MEDICAL CARE IF:  You have persistent pain. You have chest pain. You have shortness of breath. You faint. You have leg pain. You have stomach pain. Your vaginal bleeding saturates two or more sanitary pads in 1 hour. MAKE SURE YOU:  Understand these instructions. Will watch your condition. Will get help right away if you are not doing well or  get worse. Document Released: 12/22/1999 Document Revised: 05/10/2013 Document Reviewed: 08/21/2011 [facility] Patient Information 2015 [place], [place]. This  information is not intended to replace advice given to you by your health care provider. Make sure you discuss any questions you have with your health care provider.  Sitz Bath A sitz bath is a warm water bath taken in the sitting position. The water covers only the hips and butt (buttocks). We recommend using one that fits in the toilet, to help with ease of use and cleanliness. It may be used for either healing or cleaning purposes. Sitz baths are also used to relieve pain, itching, or muscle tightening (spasms). The water may contain medicine. Moist heat will help you heal and relax.  HOME CARE  Take 3 to 4 sitz baths a day. Fill the bathtub half-full with warm water. Sit in the water and open the drain a little. Turn on the warm water to keep the tub half-full. Keep the water running constantly. Soak in the water for 15 to 20 minutes. After the sitz bath, pat the affected area dry. GET HELP RIGHT AWAY IF: You get worse instead of better. Stop the sitz baths if you get worse. MAKE SURE YOU: Understand these instructions. Will watch your condition. Will get help right away if you are not doing well or get worse. Document Released: 02/01/2004 Document Revised: 09/18/2011 Document Reviewed: 04/23/2010 [facility] Patient Information 2015 [place], [place]. This information is not intended to replace advice given to you by your health care provider. Make sure you discuss any questions you have with your health care provider.

## 2023-07-20 NOTE — Plan of Care (Signed)
  Problem: Education: Goal: Knowledge of General Education information will improve Description: Including pain rating scale, medication(s)/side effects and non-pharmacologic comfort measures Outcome: Progressing   Problem: Health Behavior/Discharge Planning: Goal: Ability to manage health-related needs will improve Outcome: Progressing   Problem: Clinical Measurements: Goal: Ability to maintain clinical measurements within normal limits will improve Outcome: Progressing Goal: Will remain free from infection Outcome: Progressing Goal: Diagnostic test results will improve Outcome: Progressing Goal: Respiratory complications will improve Outcome: Progressing Goal: Cardiovascular complication will be avoided Outcome: Progressing   Problem: Activity: Goal: Risk for activity intolerance will decrease Outcome: Progressing   Problem: Nutrition: Goal: Adequate nutrition will be maintained Outcome: Progressing   Problem: Coping: Goal: Level of anxiety will decrease Outcome: Progressing   Problem: Elimination: Goal: Will not experience complications related to bowel motility Outcome: Progressing Goal: Will not experience complications related to urinary retention Outcome: Progressing   Problem: Pain Managment: Goal: General experience of comfort will improve and/or be controlled Outcome: Progressing   Problem: Safety: Goal: Ability to remain free from injury will improve Outcome: Progressing   Problem: Skin Integrity: Goal: Risk for impaired skin integrity will decrease Outcome: Progressing   Problem: Education: Goal: Knowledge of disease or condition will improve Outcome: Progressing Goal: Knowledge of the prescribed therapeutic regimen will improve Outcome: Progressing Goal: Individualized Educational Video(s) Outcome: Progressing   Problem: Clinical Measurements: Goal: Complications related to the disease process, condition or treatment will be avoided or  minimized Outcome: Progressing   Problem: Education: Goal: Knowledge of Childbirth will improve Outcome: Progressing Goal: Ability to make informed decisions regarding treatment and plan of care will improve Outcome: Progressing Goal: Ability to state and carry out methods to decrease the pain will improve Outcome: Progressing Goal: Individualized Educational Video(s) Outcome: Progressing   Problem: Coping: Goal: Ability to verbalize concerns and feelings about labor and delivery will improve Outcome: Progressing   Problem: Life Cycle: Goal: Ability to make normal progression through stages of labor will improve Outcome: Progressing Goal: Ability to effectively push during vaginal delivery will improve Outcome: Progressing   Problem: Role Relationship: Goal: Will demonstrate positive interactions with the child Outcome: Progressing   Problem: Safety: Goal: Risk of complications during labor and delivery will decrease Outcome: Progressing   Problem: Pain Management: Goal: Relief or control of pain from uterine contractions will improve Outcome: Progressing   Problem: Education: Goal: Knowledge of condition will improve Outcome: Progressing Goal: Individualized Educational Video(s) Outcome: Progressing Goal: Individualized Newborn Educational Video(s) Outcome: Progressing   Problem: Activity: Goal: Will verbalize the importance of balancing activity with adequate rest periods Outcome: Progressing Goal: Ability to tolerate increased activity will improve Outcome: Progressing   Problem: Coping: Goal: Ability to identify and utilize available resources and services will improve Outcome: Progressing   Problem: Life Cycle: Goal: Chance of risk for complications during the postpartum period will decrease Outcome: Progressing   Problem: Role Relationship: Goal: Ability to demonstrate positive interaction with newborn will improve Outcome: Progressing   Problem:  Skin Integrity: Goal: Demonstration of wound healing without infection will improve Outcome: Progressing

## 2023-07-22 ENCOUNTER — Other Ambulatory Visit: Payer: Self-pay

## 2023-07-22 ENCOUNTER — Emergency Department
Admission: EM | Admit: 2023-07-22 | Discharge: 2023-07-22 | Disposition: A | Discharge status: Home / Self Care | Attending: Emergency Medicine | Admitting: Emergency Medicine

## 2023-07-22 ENCOUNTER — Emergency Department

## 2023-07-22 DIAGNOSIS — Y828 Other medical devices associated with adverse incidents: Secondary | ICD-10-CM | POA: Insufficient documentation

## 2023-07-22 DIAGNOSIS — R600 Localized edema: Secondary | ICD-10-CM

## 2023-07-22 DIAGNOSIS — M7989 Other specified soft tissue disorders: Secondary | ICD-10-CM | POA: Diagnosis present

## 2023-07-22 DIAGNOSIS — I809 Phlebitis and thrombophlebitis of unspecified site: Secondary | ICD-10-CM

## 2023-07-22 DIAGNOSIS — L539 Erythematous condition, unspecified: Secondary | ICD-10-CM

## 2023-07-22 DIAGNOSIS — T80212A Local infection due to central venous catheter, initial encounter: Secondary | ICD-10-CM | POA: Diagnosis not present

## 2023-07-22 MED ORDER — IBUPROFEN 800 MG PO TABS
800.0000 mg | ORAL_TABLET | Freq: Once | ORAL | Status: AC
  Administered 2023-07-22: 800 mg via ORAL
  Filled 2023-07-22: qty 1

## 2023-07-22 NOTE — ED Triage Notes (Signed)
 Pt comes with right arm swelling redness and warmth. Pt was here recently for delivery and had IV infusion of iron . Pt  unsure if infection or blood clot.

## 2023-07-22 NOTE — ED Provider Notes (Signed)
 [facility] Emergency Department Provider Note     Event Date/Time   First MD Initiated Contact with Patient 07/22/23 1349     (approximate)   History   Arm Injury   HPI  Katherine Blackburn is a 23 y.o. female presents to the ED for evaluation of right arm swelling and pain. Patient reports she recently had an IV infusion of iron  following delivery of her newborn.  She denies fever, and discharge.  Pain is localized to her right antecubital region and described as being slammed by a hammer.     Physical Exam   Triage Vital Signs: ED Triage Vitals  Encounter Vitals Group     BP 07/22/23 1337 121/89     Girls Systolic BP Percentile --      Girls Diastolic BP Percentile --      Boys Systolic BP Percentile --      Boys Diastolic BP Percentile --      Pulse Rate 07/22/23 1337 (!) 106     Resp 07/22/23 1337 18     Temp 07/22/23 1337 98 F (36.7 C)     Temp src --      SpO2 07/22/23 1337 100 %     Weight 07/22/23 1334 130 lb (59 kg)     Height 07/22/23 1334 5' 4 (1.626 m)     Head Circumference --      Peak Flow --      Pain Score 07/22/23 1334 6     Pain Loc --      Pain Education --      Exclude from Growth Chart --     Most recent vital signs: Vitals:   07/22/23 1350 07/22/23 1621  BP:  118/86  Pulse:  90  Resp:  18  Temp:    SpO2: 100% 100%    General Awake, no distress.  HEENT NCAT. CV:  Good peripheral perfusion.  RESP:  Normal effort.  ABD:  No distention.  Other:  Localized erythemic raised area over the cephalic vein region of the right arm.  Tenderness to touch.  Warmth noted.  No fluctuance.  Severe tenderness with arm extension.   ED Results / Procedures / Treatments   Labs (all labs ordered are listed, but only abnormal results are displayed) Labs Reviewed - No data to display  RADIOLOGY  I personally viewed and evaluated these images as part of my medical decision making, as well as reviewing the written report  by the radiologist.  ED Provider Interpretation: Segment of superficial thrombophlebitis of the right cephalic vein  US  Venous Img Upper Uni Right(DVT) Result Date: 07/22/2023 CLINICAL DATA:  Swelling, redness, warmth post IV iron  infusion EXAM: RIGHT UPPER EXTREMITY VENOUS DOPPLER ULTRASOUND TECHNIQUE: Gray-scale sonography with graded compression, as well as color Doppler and duplex ultrasound were performed to evaluate the upper extremity deep venous system from the level of the subclavian vein and including the jugular, axillary, basilic, radial, ulnar and upper cephalic vein. Spectral Doppler was utilized to evaluate flow at rest and with distal augmentation maneuvers. COMPARISON:  None Available. FINDINGS: Contralateral Subclavian Vein: Respiratory phasicity is normal and symmetric with the symptomatic side. No evidence of thrombus. Normal compressibility. Internal Jugular Vein: No evidence of thrombus. Normal compressibility, respiratory phasicity and response to augmentation. Subclavian Vein: No evidence of thrombus. Normal compressibility, respiratory phasicity and response to augmentation. Axillary Vein: No evidence of thrombus. Normal compressibility, respiratory phasicity and response to augmentation. Cephalic Vein: Hypoechoic  noncompressible thrombus in its midportion, with no color flow signal, corresponding to region of symptomatology. Basilic Vein: No evidence of thrombus. Normal compressibility, respiratory phasicity and response to augmentation. Brachial Veins: No evidence of thrombus. Normal compressibility, respiratory phasicity and response to augmentation. Radial Veins: No evidence of thrombus. Normal compressibility, respiratory phasicity and response to augmentation. Ulnar Veins: No evidence of thrombus. Normal compressibility, respiratory phasicity and response to augmentation. Venous Reflux:  None visualized. Other Findings:  None visualized. IMPRESSION: 1. Segmental superficial  thrombophlebitis of the right cephalic vein. 2. No evidence of DVT within the right upper extremity. Electronically Signed   By: JONETTA Faes M.D.   On: 07/22/2023 15:26    PROCEDURES:  Critical Care performed: No  Procedures  MEDICATIONS ORDERED IN ED: Medications  ibuprofen  (ADVIL ) tablet 800 mg (800 mg Oral Given 07/22/23 1616)    IMPRESSION / MDM / ASSESSMENT AND PLAN / ED COURSE  I reviewed the triage vital signs and the nursing notes.                                 23 y.o. female presents to the emergency department for evaluation and treatment of right arm pain. See HPI for further details.   Differential diagnosis includes, but is not limited to thrombophlebitis, DVT, cellulitis, abscess  Patient's presentation is most consistent with acute complicated illness / injury requiring diagnostic workup.  Patient alert and oriented.  She is hemodynamic stable.  Initially pulses 106 but this has improved during the duration of her ED visit.  Ultrasound obtained in triage reveals segmental superficial thrombophlebitis of the right cephalic vein.  Physical exam findings are consistent with this.  Low suspicion of cellulitis given no systemic symptoms.  Advised warm compress and NSAID use.  For comfort patient placed in sling.  Encourage close follow-up in 7 to 10 days with primary care provider for resolution.  Strict ED return precautions discussed.  Patient stable condition for discharge home and outpatient management.   FINAL CLINICAL IMPRESSION(S) / ED DIAGNOSES   Final diagnoses:  Superficial thrombophlebitis after intravenous administration of drug   Rx / DC Orders   ED Discharge Orders     None        Note:  This document was prepared using Dragon voice recognition software and may include unintentional dictation errors.    Margrette, Brexton Sofia A, PA-C 07/22/23 1819    Clarine Ozell LABOR, MD 07/23/23 2348

## 2023-07-22 NOTE — Discharge Instructions (Signed)
 Your evaluated in the ED for right arm pain.  Your ultrasound reveals a superficial thrombophlebitis.  Thrombophlebitis is a condition in which a blood clot and inflammation occur inside a vein. This can happen in the arms, legs, or torso. Thrombophlebitis may involve superficial veins, deep veins, or both. Superficial veins are close to the surface of the body and are part of the superficial venous system. Veins that are deeper inside the body are part of the deep venous system.  Small clots can be treated with warm compression, NSAIDs such as ibuprofen , elevation, follow up within 7-10 days to evaluate for progression with your primary care provider. IF symptoms worsen in the interim, please return to ED for further evaluation
